# Patient Record
Sex: Male | Born: 1990 | Race: Black or African American
Health system: Northeastern US, Academic
[De-identification: ages and names within clinical notes are randomized; demographics above are authoritative.]

## PROBLEM LIST (undated history)

## (undated) ENCOUNTER — Ambulatory Visit: Admission: RE | Payer: Self-pay | Source: Ambulatory Visit | Admitting: Occupational Medicine

## (undated) DIAGNOSIS — J45909 Unspecified asthma, uncomplicated: Secondary | ICD-10-CM

---

## 2015-06-24 ENCOUNTER — Emergency Department (HOSPITAL_COMMUNITY)
Admission: EM | Admit: 2015-06-24 | Discharge: 2015-06-24 | Disposition: A | Discharge status: Home / Self Care | Attending: Emergency Medicine | Admitting: Emergency Medicine

## 2015-06-24 ENCOUNTER — Emergency Department (HOSPITAL_COMMUNITY)

## 2015-06-24 ENCOUNTER — Encounter (HOSPITAL_COMMUNITY): Payer: Self-pay | Admitting: Emergency Medicine

## 2015-06-24 DIAGNOSIS — R109 Unspecified abdominal pain: Secondary | ICD-10-CM | POA: Diagnosis present

## 2015-06-24 DIAGNOSIS — R509 Fever, unspecified: Secondary | ICD-10-CM | POA: Insufficient documentation

## 2015-06-24 DIAGNOSIS — R1084 Generalized abdominal pain: Secondary | ICD-10-CM | POA: Insufficient documentation

## 2015-06-24 DIAGNOSIS — J45909 Unspecified asthma, uncomplicated: Secondary | ICD-10-CM | POA: Insufficient documentation

## 2015-06-24 DIAGNOSIS — Z87891 Personal history of nicotine dependence: Secondary | ICD-10-CM | POA: Insufficient documentation

## 2015-06-24 HISTORY — DX: Unspecified asthma, uncomplicated: J45.909

## 2015-06-24 LAB — URINALYSIS, ROUTINE W REFLEX MICROSCOPIC
Bilirubin Urine: NEGATIVE
GLUCOSE, UA: NEGATIVE mg/dL
Hgb urine dipstick: NEGATIVE
KETONES UR: NEGATIVE mg/dL
LEUKOCYTES UA: NEGATIVE
NITRITE: NEGATIVE
PH: 5.5 (ref 5.0–8.0)
Protein, ur: NEGATIVE mg/dL
SPECIFIC GRAVITY, URINE: 1.01 (ref 1.005–1.030)

## 2015-06-24 LAB — COMPREHENSIVE METABOLIC PANEL
ALBUMIN: 4.8 g/dL (ref 3.5–5.0)
ALT: 18 U/L (ref 17–63)
ANION GAP: 6 (ref 5–15)
AST: 26 U/L (ref 15–41)
Alkaline Phosphatase: 74 U/L (ref 38–126)
BUN: 13 mg/dL (ref 6–20)
CHLORIDE: 106 mmol/L (ref 101–111)
CO2: 28 mmol/L (ref 22–32)
Calcium: 9.2 mg/dL (ref 8.9–10.3)
Creatinine, Ser: 1.31 mg/dL — ABNORMAL HIGH (ref 0.61–1.24)
GFR calc Af Amer: >60 mL/min (ref >60)
Glucose, Bld: 100 mg/dL — ABNORMAL HIGH (ref 65–99)
POTASSIUM: 3.3 mmol/L — AB (ref 3.5–5.1)
Sodium: 140 mmol/L (ref 135–145)
Total Bilirubin: 1.7 mg/dL — ABNORMAL HIGH (ref 0.3–1.2)
Total Protein: 7.8 g/dL (ref 6.5–8.1)

## 2015-06-24 LAB — CBC
HEMATOCRIT: 40.2 % (ref 39.0–52.0)
HEMOGLOBIN: 13.9 g/dL (ref 13.0–17.0)
MCH: 31.3 pg (ref 26.0–34.0)
MCHC: 34.6 g/dL (ref 30.0–36.0)
MCV: 90.5 fL (ref 78.0–100.0)
Platelets: 255 10*3/uL (ref 150–400)
RBC: 4.44 MIL/uL (ref 4.22–5.81)
RDW: 12.9 % (ref 11.5–15.5)
WBC: 5.8 10*3/uL (ref 4.0–10.5)

## 2015-06-24 LAB — LIPASE, BLOOD: LIPASE: 20 U/L (ref 11–51)

## 2015-06-24 MED ORDER — BISMUTH SUBSALICYLATE 262 MG/15ML PO SUSP
ORAL | Status: AC
  Filled 2015-06-24: qty 236

## 2015-06-24 MED ORDER — BISMUTH SUBSALICYLATE 262 MG/15ML PO SUSP
30.0000 mL | Freq: Once | ORAL | Status: AC
  Administered 2015-06-24: 30 mL via ORAL
  Filled 2015-06-24: qty 118

## 2015-06-24 MED ORDER — DIATRIZOATE MEGLUMINE & SODIUM 66-10 % PO SOLN
ORAL | Status: AC
  Filled 2015-06-24: qty 30

## 2015-06-24 MED ORDER — ACETAMINOPHEN 500 MG PO TABS
1000.0000 mg | ORAL_TABLET | Freq: Once | ORAL | Status: AC
  Administered 2015-06-24: 1000 mg via ORAL
  Filled 2015-06-24: qty 2

## 2015-06-24 MED ORDER — BISMUTH SUBSALICYLATE 262 MG PO CHEW: 524.0000 mg | CHEWABLE_TABLET | ORAL | Status: AC | PRN

## 2015-06-24 MED ORDER — IOHEXOL 300 MG/ML  SOLN
100.0000 mL | Freq: Once | INTRAMUSCULAR | Status: AC | PRN
Start: 2015-06-24 — End: 2015-06-24
  Administered 2015-06-24: 100 mL via INTRAVENOUS

## 2015-06-24 NOTE — ED Notes (Signed)
Patient given graham crackers and gingerale 

## 2015-06-24 NOTE — ED Notes (Signed)
[facility]. Pt c/o bilateral lower abdominal pain and SOB that began this morning. Pt hx of asthma. Denies urinary symptoms. Denies n/v/d.

## 2015-06-24 NOTE — Discharge Instructions (Signed)
Take Tylenol every 4 hours as needed for fever. Gradually advance your diet over the next 1-2 days.   Abdominal Pain, Adult Many things can cause abdominal pain. Usually, abdominal pain is not caused by a disease and will improve without treatment. It can often be observed and treated at home. Your health care provider will do a physical exam and possibly order blood tests and X-rays to help determine the seriousness of your pain. However, in many cases, more time must pass before a clear cause of the pain can be found. Before that point, your health care provider may not know if you need more testing or further treatment. HOME CARE INSTRUCTIONS Monitor your abdominal pain for any changes. The following actions may help to alleviate any discomfort you are experiencing:  Only take over-the-counter or prescription medicines as directed by your health care provider.  Do not take laxatives unless directed to do so by your health care provider.  Try a clear liquid diet (broth, tea, or water) as directed by your health care provider. Slowly move to a bland diet as tolerated. SEEK MEDICAL CARE IF:  You have unexplained abdominal pain.  You have abdominal pain associated with nausea or diarrhea.  You have pain when you urinate or have a bowel movement.  You experience abdominal pain that wakes you in the night.  You have abdominal pain that is worsened or improved by eating food.  You have abdominal pain that is worsened with eating fatty foods.  You have a fever. SEEK IMMEDIATE MEDICAL CARE IF:  Your pain does not go away within 2 hours.  You keep throwing up (vomiting).  Your pain is felt only in portions of the abdomen, such as the right side or the left lower portion of the abdomen.  You pass bloody or black tarry stools. MAKE SURE YOU:  Understand these instructions.  Will watch your condition.  Will get help right away if you are not doing well or get worse.   This  information is not intended to replace advice given to you by your health care provider. Make sure you discuss any questions you have with your health care provider.   Document Released: 03/19/2005 Document Revised: 02/28/2015 Document Reviewed: 02/16/2013 Elsevier Interactive Patient Education 2016 [employer]  Fever, Adult A fever is an increase in the body's temperature. It is usually defined as a temperature of 100F (38C) or higher. Brief mild or moderate fevers generally have no long-term effects, and they often do not require treatment. Moderate or high fevers may make you feel uncomfortable and can sometimes be a sign of a serious illness or disease. The sweating that may occur with repeated or prolonged fever may also cause dehydration. Fever is confirmed by taking a temperature with a thermometer. A measured temperature can vary with:  Age.  Time of day.  Location of the thermometer:  Mouth (oral).  Rectum (rectal).  Ear (tympanic).  Underarm (axillary).  Forehead (temporal). HOME CARE INSTRUCTIONS Pay attention to any changes in your symptoms. Take these actions to help with your condition:  Take over-the counter and prescription medicines only as told by your health care provider. Follow the dosing instructions carefully.  If you were prescribed an antibiotic medicine, take it as told by your health care provider. Do not stop taking the antibiotic even if you start to feel better.  Rest as needed.  Drink enough fluid to keep your urine clear or pale yellow. This helps to prevent dehydration.  Sponge yourself or bathe with room-temperature water to help reduce your body temperature as needed. Do not use ice water.  Do not overbundle yourself in blankets or heavy clothes. SEEK MEDICAL CARE IF:  You vomit.  You cannot eat or drink without vomiting.  You have diarrhea.  You have pain when you urinate.  Your symptoms do not improve with treatment.  You  develop new symptoms.  You develop excessive weakness. SEEK IMMEDIATE MEDICAL CARE IF:  You have shortness of breath or have trouble breathing.  You are dizzy or you faint.  You are disoriented or confused.  You develop signs of dehydration, such as a dry mouth, decreased urination, or paleness.  You develop severe pain in your abdomen.  You have persistent vomiting or diarrhea.  You develop a skin rash.  Your symptoms suddenly get worse.   This information is not intended to replace advice given to you by your health care provider. Make sure you discuss any questions you have with your health care provider.   Document Released: 12/03/2000 Document Revised: 02/28/2015 Document Reviewed: 08/03/2014 Elsevier Interactive Patient Education [employer]

## 2015-06-25 NOTE — ED Provider Notes (Signed)
CSN: [id number]     Arrival date & time 06/24/15  1342 History   First MD Initiated Contact with Patient 06/24/15 1504     Chief Complaint  Patient presents with  . Abdominal Pain     (Consider location/radiation/quality/duration/timing/severity/associated sxs/prior Treatment) Patient is a 25 y.o. male presenting with abdominal pain. The history is provided by the patient.  Abdominal Pain   Alexander Crosby is a 25 y.o. male who c/o crampy intermittent abdominal pain for < 1 day. No fever, vomiting or diarrhea. No sick contacts. Incarcerated. No chronic abdominal issues. No cough or SOB.There are no ither known modifying factors    Past Medical History  Diagnosis Date  . Asthma    History reviewed. No pertinent past surgical history. No family history on file. Social History  Substance Use Topics  . Smoking status: Former Smoker    Types: Cigarettes  . Smokeless tobacco: None  . Alcohol Use: No    Review of Systems  Gastrointestinal: Positive for abdominal pain.  All other systems reviewed and are negative.     Allergies  Review of patient's allergies indicates no known allergies.  Home Medications   Prior to Admission medications   Medication Sig Start Date End Date Taking? Authorizing Provider  bismuth subsalicylate (PEPTO-BISMOL) 262 MG chewable tablet Chew 2 tablets (524 mg total) by mouth as needed for indigestion. 06/24/15   Mancel Bale, MD   BP 146/73 mmHg  Pulse 81  Temp(Src) 101.9 F (38.8 C) (Oral)  Resp 18  Ht 6\' 4"  (1.93 m)  Wt 220 lb (99.791 kg)  BMI 26.79 kg/m2  SpO2 100% Physical Exam  Constitutional: He is oriented to person, place, and time. He appears well-developed and well-nourished.  HENT:  Head: Normocephalic and atraumatic.  Right Ear: External ear normal.  Left Ear: External ear normal.  Eyes: Conjunctivae and EOM are normal. Pupils are equal, round, and reactive to light.  Neck: Normal range of motion and phonation normal. Neck  supple.  Cardiovascular: Normal rate, regular rhythm and normal heart sounds.   Pulmonary/Chest: Effort normal and breath sounds normal. He exhibits no bony tenderness.  Abdominal: Soft. There is no tenderness.  Musculoskeletal: Normal range of motion.  Neurological: He is alert and oriented to person, place, and time. No cranial nerve deficit or sensory deficit. He exhibits normal muscle tone. Coordination normal.  Skin: Skin is warm, dry and intact.  Psychiatric: He has a normal mood and affect. His behavior is normal. Judgment and thought content normal.  Nursing note and vitals reviewed.   ED Course  Procedures (including critical care time)  Medications  bismuth subsalicylate (PEPTO BISMOL) 262 MG/15ML suspension 30 mL (30 mLs Oral Given 06/24/15 1644)  iohexol (OMNIPAQUE) 300 MG/ML solution 100 mL (100 mLs Intravenous Contrast Given 06/24/15 1740)  acetaminophen (TYLENOL) tablet 1,000 mg (1,000 mg Oral Given 06/24/15 1851)    Patient Vitals for the past 24 hrs:  BP Temp Temp src Pulse Resp SpO2 Height Weight  06/24/15 1843 146/73 mmHg 101.9 F (38.8 C) Oral 81 18 100 % - -  06/24/15 1645 145/86 mmHg - - 89 16 100 % - -  06/24/15 1350 157/93 mmHg 99.1 F (37.3 C) Oral 75 18 100 % 6\' 4"  (1.93 m) 220 lb (99.791 kg)    Pepto-Bismol given without relief.  CT ordered to r/o colitis, or appendicitis.  Increased temperature at d/c.   Labs Review Labs Reviewed  COMPREHENSIVE METABOLIC PANEL - Abnormal; Notable for the following:  Potassium 3.3 (*)    Glucose, Bld 100 (*)    Creatinine, Ser 1.31 (*)    Total Bilirubin 1.7 (*)    All other components within normal limits  LIPASE, BLOOD  CBC  URINALYSIS, ROUTINE W REFLEX MICROSCOPIC (NOT AT [facility])    Imaging Review Dg Chest 2 View  06/24/2015  CLINICAL DATA:  Shortness of breath. EXAM: CHEST  2 VIEW COMPARISON:  None. FINDINGS: The heart size and mediastinal contours are within normal limits. Both lungs are clear. The  visualized skeletal structures are unremarkable. IMPRESSION: Normal chest. Electronically Signed   By: Francene BoyersJames  Maxwell M.D.   On: 06/24/2015 14:34   Ct Abdomen Pelvis W Contrast  06/24/2015  CLINICAL DATA:  Patient with pelvic pain since this morning. Shortness of breath. History of asthma. EXAM: CT ABDOMEN AND PELVIS WITH CONTRAST TECHNIQUE: Multidetector CT imaging of the abdomen and pelvis was performed using the standard protocol following bolus administration of intravenous contrast. CONTRAST:  100mL OMNIPAQUE IOHEXOL 300 MG/ML  SOLN COMPARISON:  None. FINDINGS: Lower chest: Normal heart size. No consolidative pulmonary opacities. No pleural effusion. Hepatobiliary: Liver is normal in size and contour. No focal hepatic lesion is identified. Gallbladder is unremarkable. No intrahepatic or extrahepatic biliary ductal dilatation. Pancreas: Unremarkable Spleen: Unremarkable Adrenals/Urinary Tract: The adrenal glands are normal. Kidneys enhance symmetrically with contrast. No hydronephrosis. Urinary bladder is unremarkable. Stomach/Bowel: No abnormal bowel wall thickening or evidence for bowel obstruction. No free fluid or free intraperitoneal air. The appendix is normal. Normal morphology to the stomach. Small amount of oral contrast within the distal esophagus, raising the possibility of reflux. Vascular/Lymphatic: Normal caliber abdominal aorta. No retroperitoneal lymphadenopathy. Other: Prostate unremarkable. Small fat containing right inguinal hernia. Musculoskeletal: No aggressive or acute appearing osseous lesions. IMPRESSION: No acute process within the abdomen or pelvis. Small amount of oral contrast material within the distal esophagus, raising the possibility of reflux. Electronically Signed   By: Annia Beltrew  Davis M.D.   On: 06/24/2015 18:12   I have personally reviewed and evaluated these images and lab results as part of my medical decision-making.   EKG Interpretation None      MDM   Final  diagnoses:  Generalized abdominal pain  Febrile illness    Nonspefic fever with abdominal cramping. Nontoxic patient with negative CT. Doubt SBI.  Nursing Notes Reviewed/ Care Coordinated Applicable Imaging Reviewed Interpretation of Laboratory Data incorporated into ED treatment  The patient appears reasonably screened and/or stabilized for discharge and I doubt any other medical condition or other [facility] requiring further screening, evaluation, or treatment in the ED at this time prior to discharge.  Plan: Home Medications- Tylenol prn; Home Treatments- Gradually advance diet; return here if the recommended treatment, does not improve the symptoms; Recommended follow up- PCP prn     Mancel BaleElliott Ercil Cassis, MD 06/25/15 1207

## 2016-04-07 ENCOUNTER — Ambulatory Visit
Admission: RE | Admit: 2016-04-07 | Discharge: 2016-04-07 | Disposition: A | Discharge status: Home / Self Care | Payer: Self-pay | Source: Ambulatory Visit | Attending: Occupational Medicine | Admitting: Occupational Medicine

## 2016-04-07 ENCOUNTER — Other Ambulatory Visit
Admission: RE | Admit: 2016-04-07 | Discharge: 2016-04-07 | Disposition: A | Discharge status: Home / Self Care | Payer: Self-pay | Source: Ambulatory Visit | Attending: Occupational Medicine | Admitting: Occupational Medicine

## 2016-04-08 LAB — VARICELLA ZOSTER IGG AB: VZV IgG: POSITIVE

## 2016-04-08 LAB — RUBELLA ANTIBODY, IGG: Rubella IgG AB: POSITIVE

## 2016-04-08 LAB — MEASLES IGG AB: Measles IgG: POSITIVE

## 2016-04-15 ENCOUNTER — Other Ambulatory Visit
Admission: RE | Admit: 2016-04-15 | Discharge: 2016-04-15 | Disposition: A | Discharge status: Home / Self Care | Payer: Self-pay | Source: Ambulatory Visit | Attending: Pediatrics | Admitting: Pediatrics

## 2016-04-15 LAB — LIPID PANEL
Chol/HDL Ratio: 3.3
Cholesterol: 151 mg/dL
HDL: 46 mg/dL
LDL Calculated: 88 mg/dL
Non HDL Cholesterol: 105 mg/dL
Triglycerides: 85 mg/dL

## 2016-04-15 LAB — COMPREHENSIVE METABOLIC PANEL
ALT: 32 U/L (ref 0–50)
AST: 54 U/L — ABNORMAL HIGH (ref 0–50)
Albumin: 4.6 g/dL (ref 3.5–5.2)
Alk Phos: 57 U/L (ref 40–130)
Anion Gap: 16 (ref 7–16)
Bilirubin,Total: 0.7 mg/dL (ref 0.0–1.2)
CO2: 24 mmol/L (ref 20–28)
Calcium: 9.2 mg/dL (ref 9.0–10.3)
Chloride: 103 mmol/L (ref 96–108)
Creatinine: 1.11 mg/dL (ref 0.67–1.17)
GFR,Black: 106 *
GFR,Caucasian: 91 *
Lab: 15 mg/dL (ref 6–20)
Potassium: 4.2 mmol/L (ref 3.3–5.1)
Sodium: 143 mmol/L (ref 133–145)
Total Protein: 7 g/dL (ref 6.3–7.7)

## 2016-04-15 LAB — HEPATITIS A,B,C,PANEL
HBV Core Ab: NEGATIVE
HBV S Ab Quant: 35.91 m[IU]/mL
HBV S Ab: POSITIVE
HBV S Ag: NEGATIVE
Hep C Ab: NEGATIVE
Hepatitis A IGG: POSITIVE

## 2016-04-15 LAB — BILIRUBIN, DIRECT: Bilirubin,Direct: <0.2 mg/dL (ref 0.0–0.3)

## 2016-04-15 LAB — GLUCOSE: Glucose: 91 mg/dL (ref 60–99)

## 2016-04-15 LAB — HIV 1&2 ANTIGEN/ANTIBODY: HIV 1&2 ANTIGEN/ANTIBODY: NONREACTIVE

## 2016-04-16 LAB — REF RESOLUTION

## 2016-04-16 LAB — HEMOGLOBIN A1C: Hemoglobin A1C: 5.7 % (ref 4.0–6.0)

## 2016-10-08 ENCOUNTER — Other Ambulatory Visit: Payer: Self-pay | Admitting: Gastroenterology

## 2016-10-08 DIAGNOSIS — R1084 Generalized abdominal pain: Secondary | ICD-10-CM

## 2016-11-05 ENCOUNTER — Ambulatory Visit: Payer: Medicaid Other

## 2016-11-18 ENCOUNTER — Ambulatory Visit: Payer: Medicaid Other

## 2017-02-16 IMAGING — DX DG CHEST 2V
3 series · 3 of 3 positions shown · non-contrast
Comparison: None.

CLINICAL DATA: Shortness of breath.

EXAM:
CHEST  2 VIEW

[chest pa (1 of 2)]
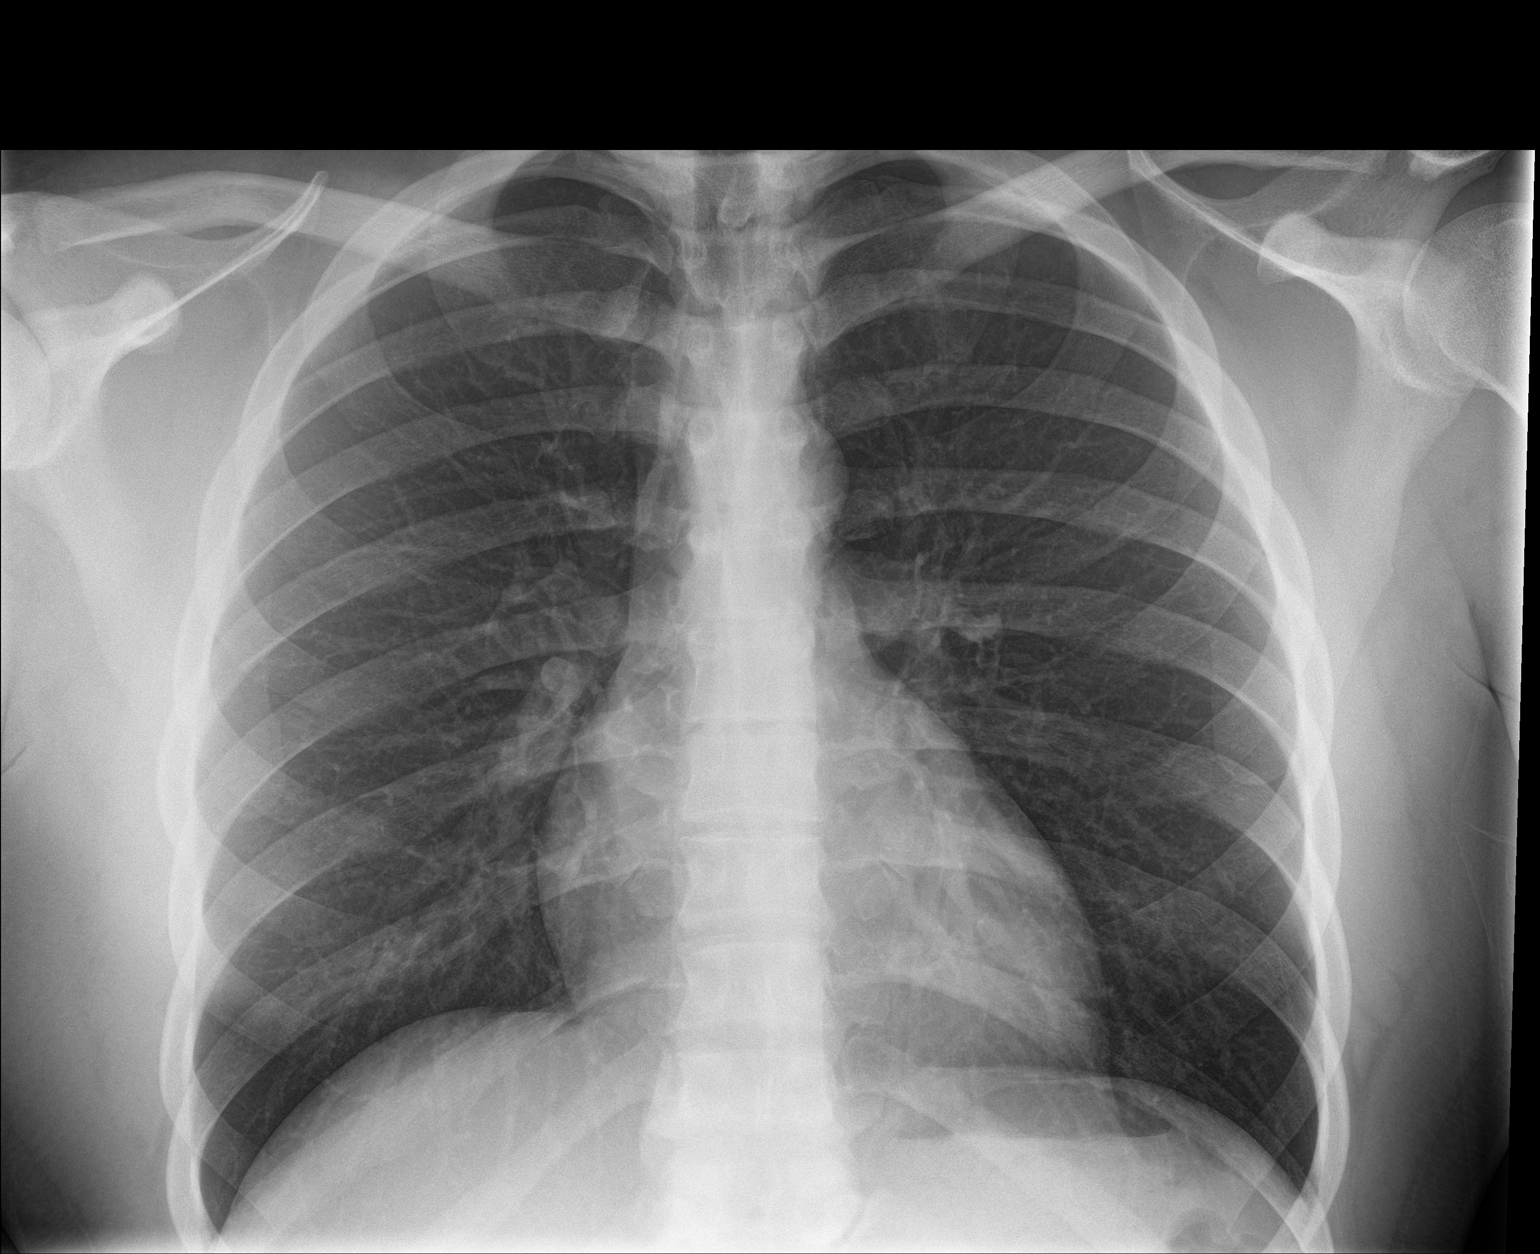

[chest lat]
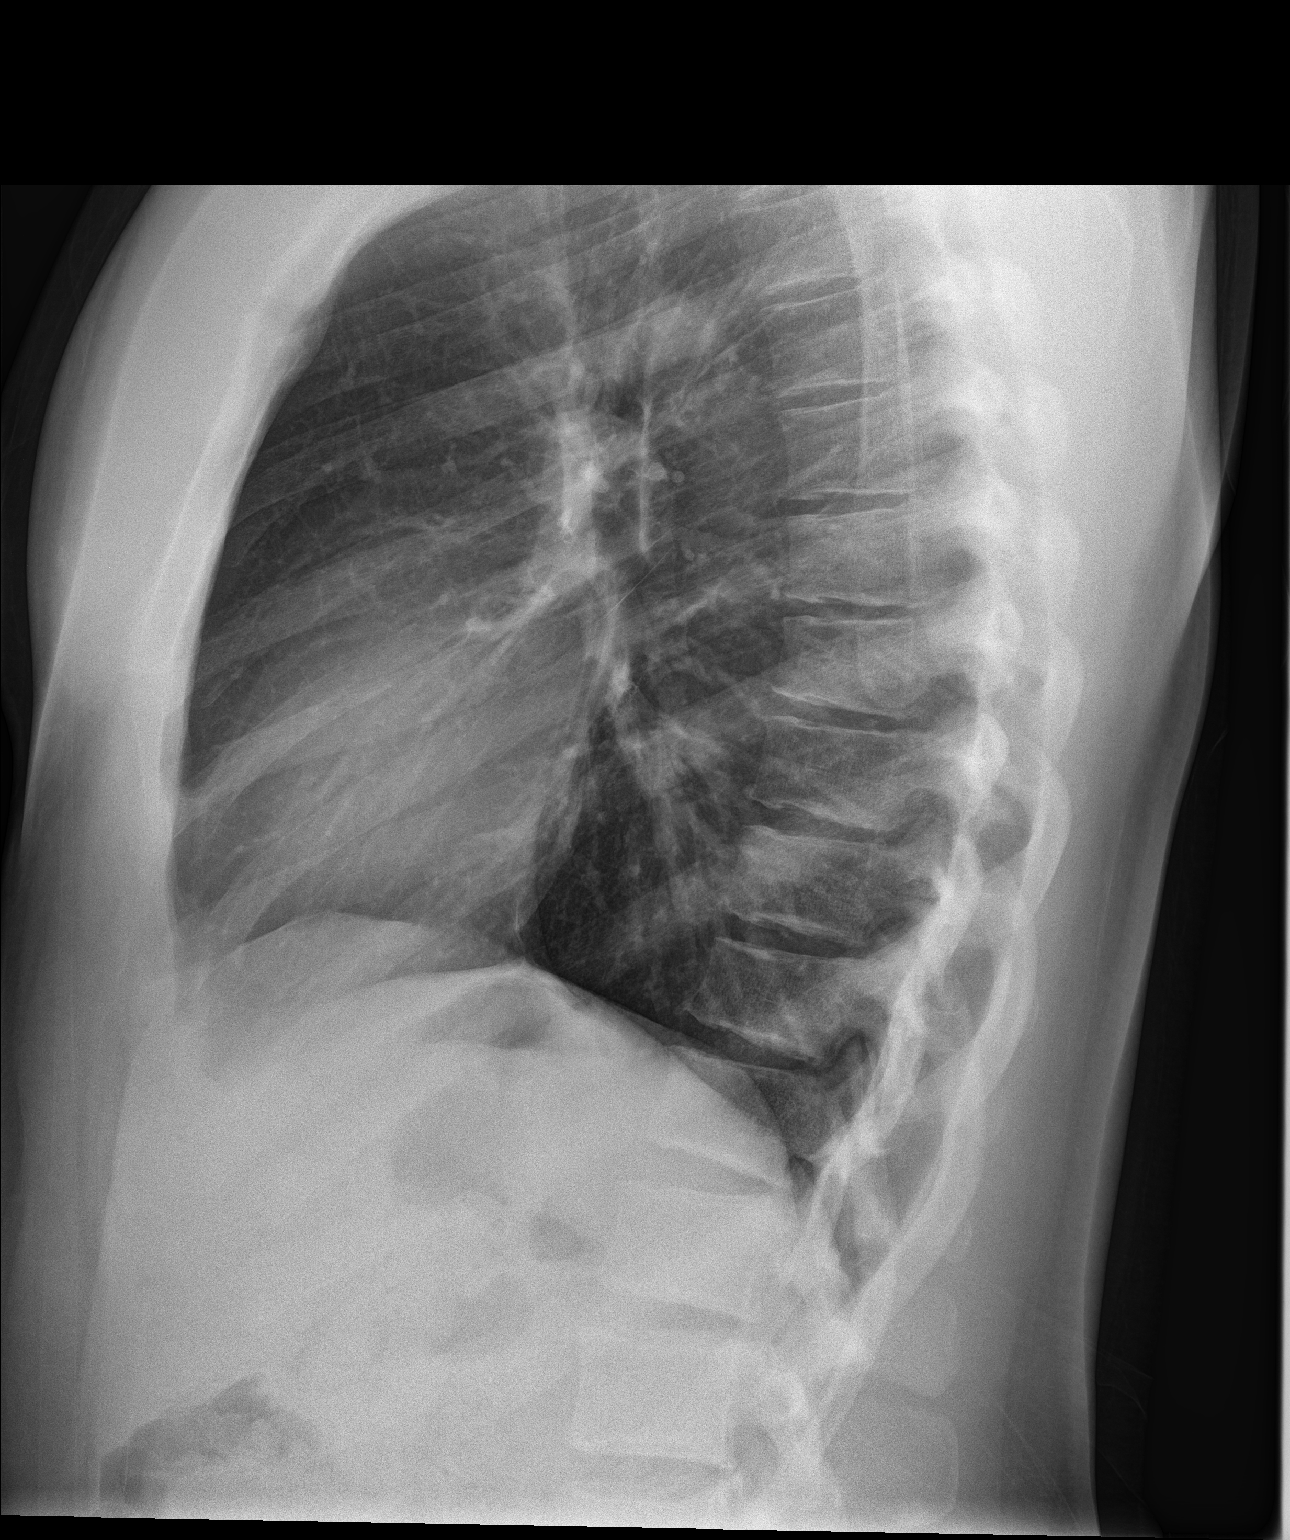

[chest pa (2 of 2)]
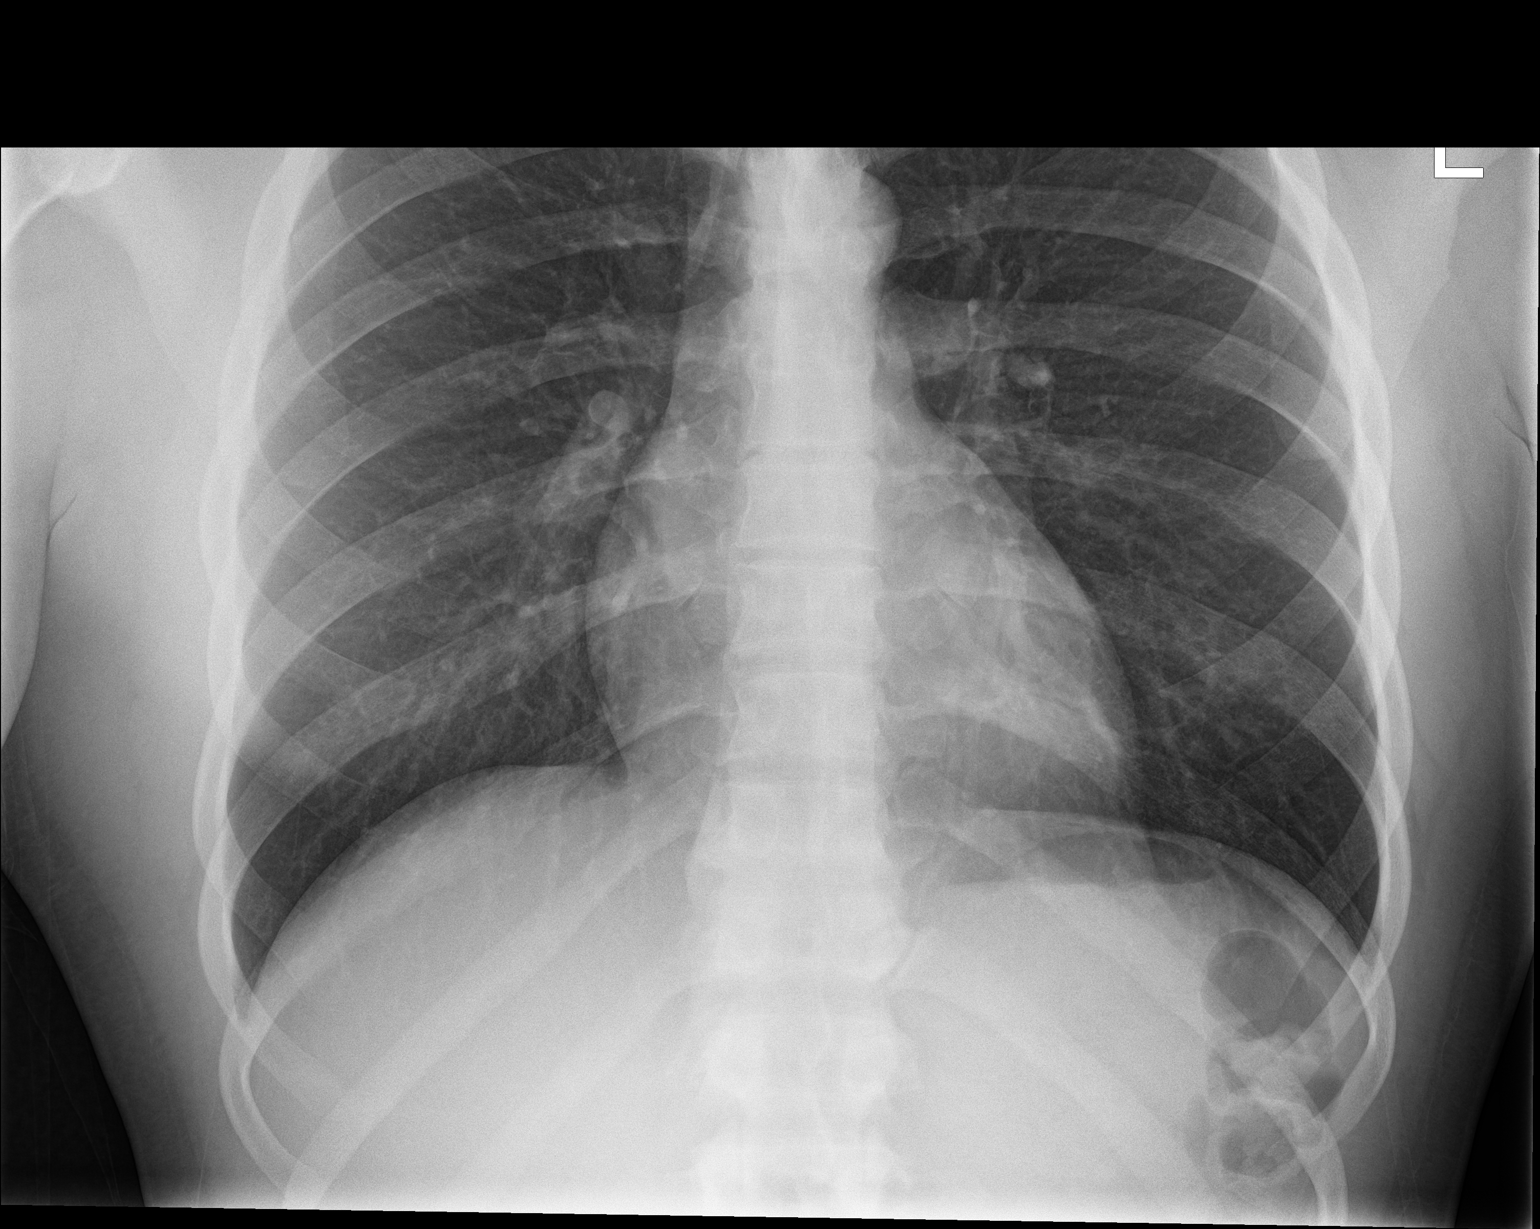

[3 of 3 positions shown; findings below may reference images not displayed]

FINDINGS: The heart size and mediastinal contours are within normal limits.
Both lungs are clear. The visualized skeletal structures are
unremarkable.
IMPRESSION: Normal chest.

## 2017-02-16 IMAGING — CT CT ABD-PELV W/ CM
2 of 4 series · 16 of 46 positions shown, 18 images · IV contrast (omnipaque)
Comparison: None.

CLINICAL DATA: Patient with pelvic pain since this morning.
Shortness of breath. History of asthma.

EXAM:
CT ABDOMEN AND PELVIS WITH CONTRAST
TECHNIQUE: Multidetector CT imaging of the abdomen and pelvis was performed
using the standard protocol following bolus administration of
intravenous contrast.
CONTRAST:  100mL OMNIPAQUE IOHEXOL 300 MG/ML  SOLN

[Series 2: abd_pel_with 5.0 b40f · axial · 0.72mm/px · z∈[-446,-6]mm · 13 of 98 slices shown, 15 images]
[im 5/98  soft-tissue]
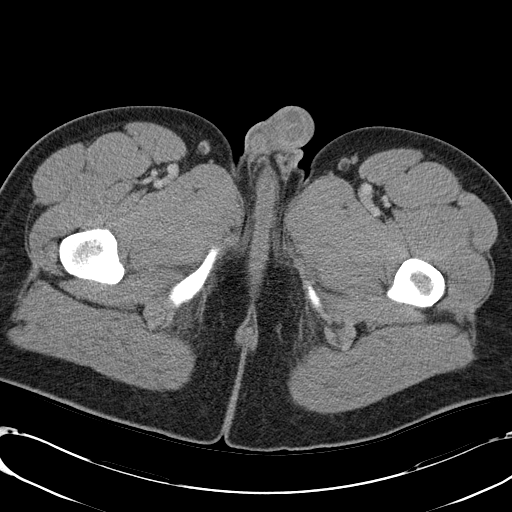
[im 5/98  bone]
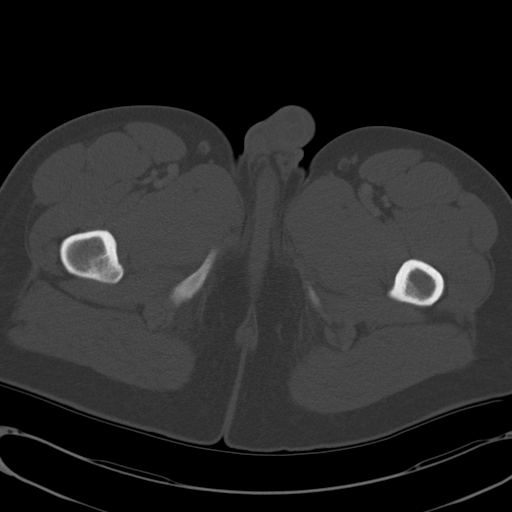
[im 13/98  soft-tissue]
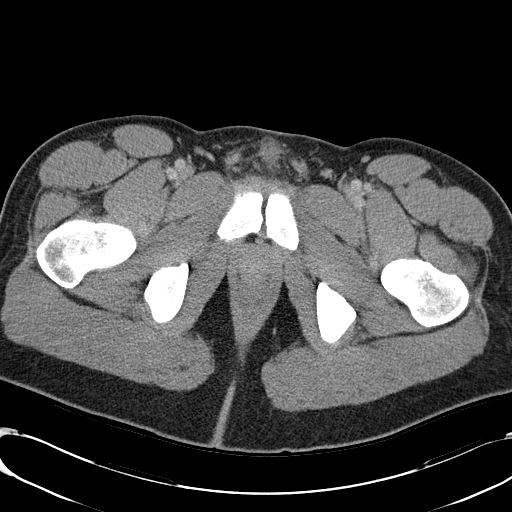
[im 21/98  soft-tissue]
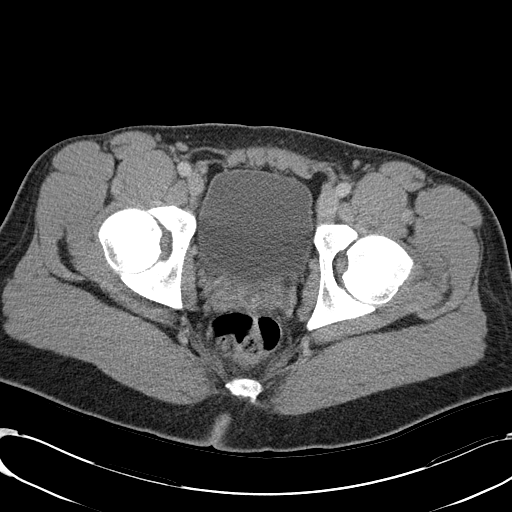
[im 29/98  soft-tissue]
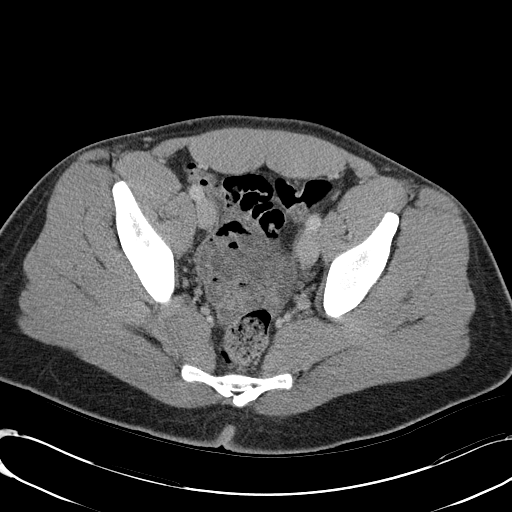
[im 33/98  soft-tissue]
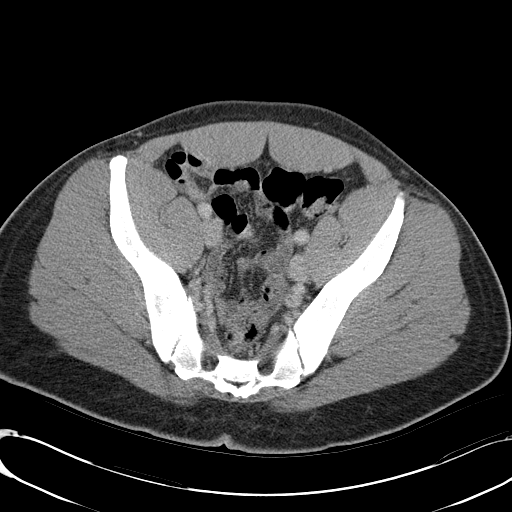
[im 41/98  soft-tissue]
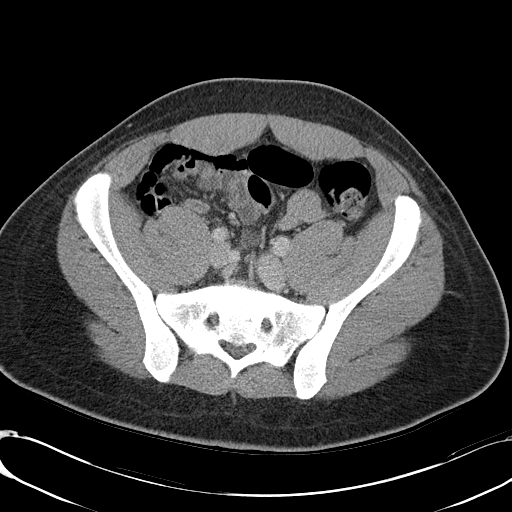
[im 49/98  soft-tissue]
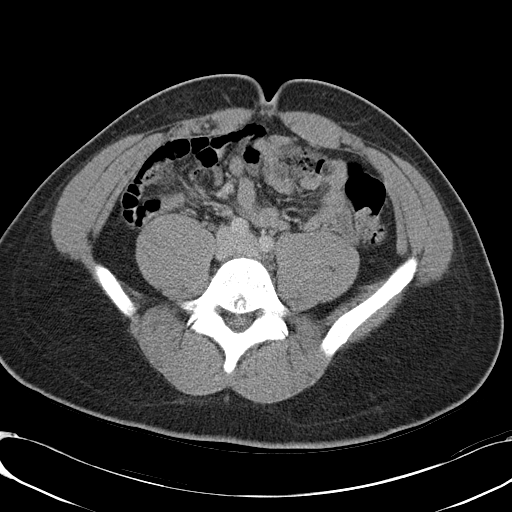
[im 57/98  soft-tissue]
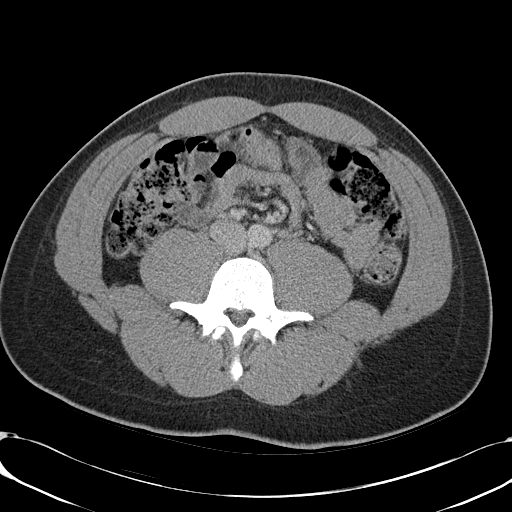
[im 65/98  soft-tissue]
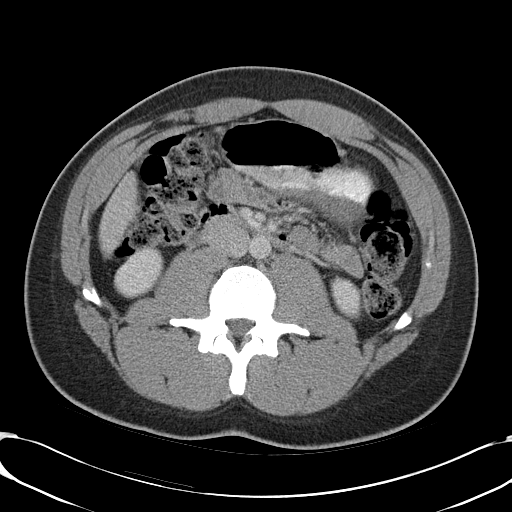
[im 65/98  bone]
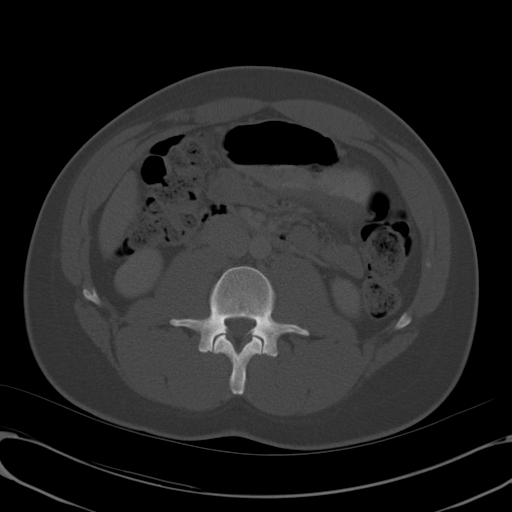
[im 69/98  soft-tissue]
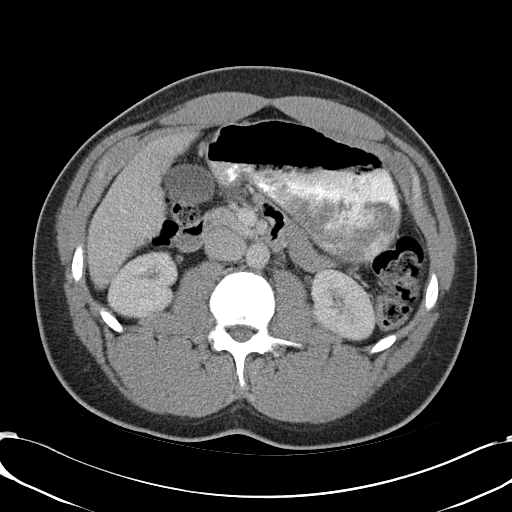
[im 77/98  soft-tissue]
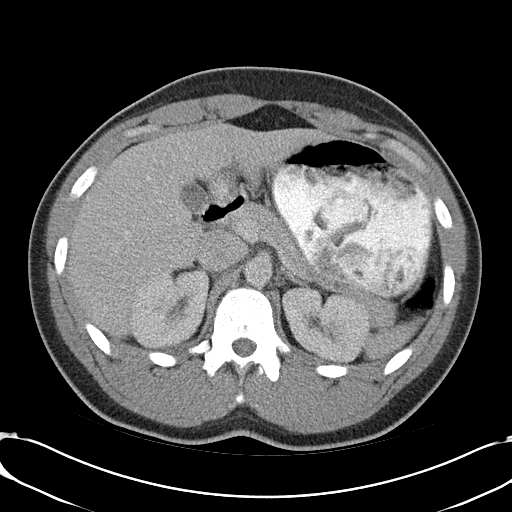
[im 85/98  soft-tissue]
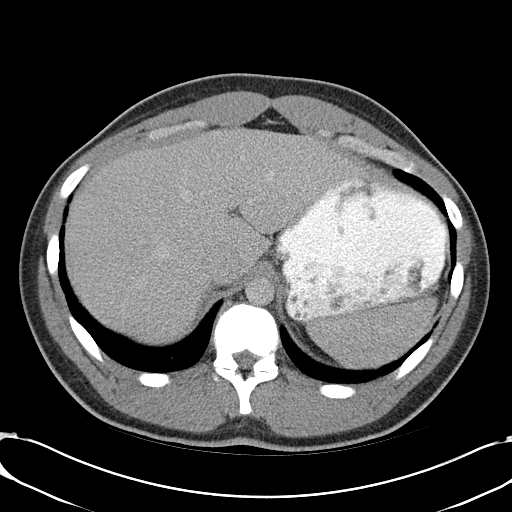
[im 93/98  soft-tissue]
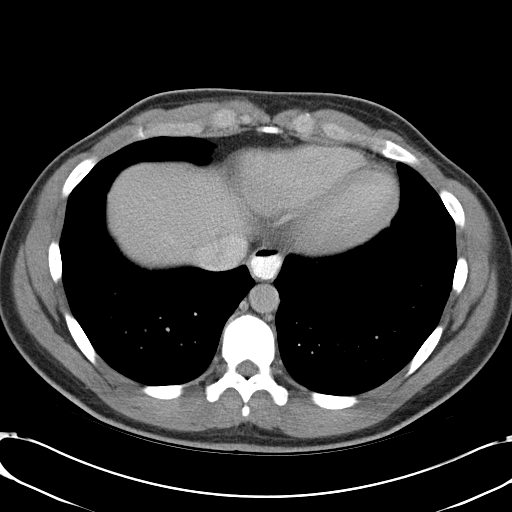

[Series 3: abd_pel_with 3.0 spo cor · coronal · 0.74mm/px · 3 of 82 slices shown]
[im 28/82  soft-tissue]
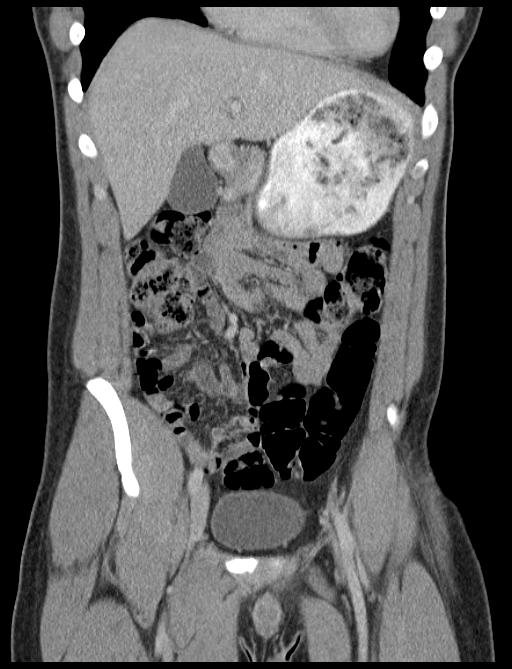
[im 37/82  soft-tissue]
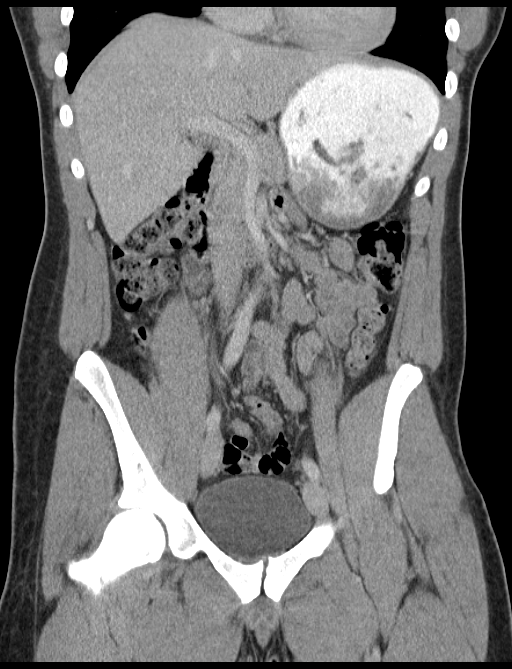
[im 46/82  soft-tissue]
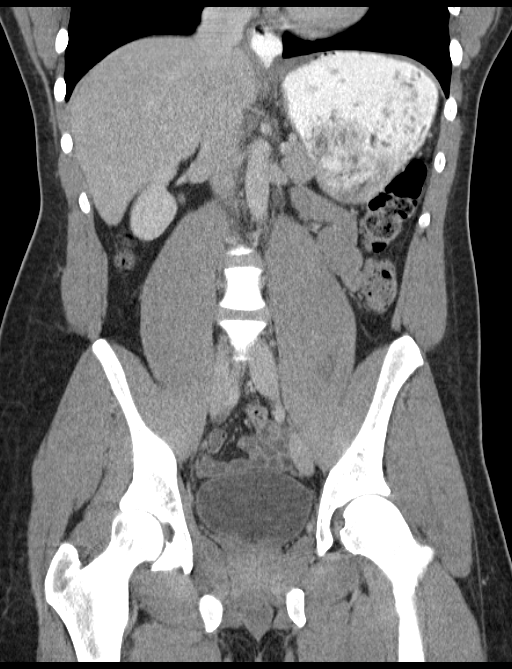

[16 of 46 positions shown; findings below may reference images not displayed]

FINDINGS: Lower chest: Normal heart size. No consolidative pulmonary
opacities. No pleural effusion.

Hepatobiliary: Liver is normal in size and contour. No focal hepatic
lesion is identified. Gallbladder is unremarkable. No intrahepatic
or extrahepatic biliary ductal dilatation.

Pancreas: Unremarkable

Spleen: Unremarkable

Adrenals/Urinary Tract: The adrenal glands are normal. Kidneys
enhance symmetrically with contrast. No hydronephrosis. Urinary
bladder is unremarkable.

Stomach/Bowel: No abnormal bowel wall thickening or evidence for
bowel obstruction. No free fluid or free intraperitoneal air. The
appendix is normal. Normal morphology to the stomach. Small amount
of oral contrast within the distal esophagus, raising the
possibility of reflux.

Vascular/Lymphatic: Normal caliber abdominal aorta. No
retroperitoneal lymphadenopathy.

Other: Prostate unremarkable. Small fat containing right inguinal
hernia.

Musculoskeletal: No aggressive or acute appearing osseous lesions.
IMPRESSION: No acute process within the abdomen or pelvis.

Small amount of oral contrast material within the distal esophagus,
raising the possibility of reflux.

## 2017-02-23 ENCOUNTER — Emergency Department: Payer: Self-pay

## 2017-02-23 ENCOUNTER — Emergency Department
Admission: EM | Admit: 2017-02-23 | Discharge: 2017-02-23 | Disposition: A | Discharge status: Home / Self Care | Payer: Self-pay | Source: Ambulatory Visit | Attending: Emergency Medicine | Admitting: Emergency Medicine

## 2017-02-23 ENCOUNTER — Encounter: Payer: Self-pay | Admitting: Emergency Medicine

## 2017-02-23 DIAGNOSIS — J4521 Mild intermittent asthma with (acute) exacerbation: Secondary | ICD-10-CM | POA: Insufficient documentation

## 2017-02-23 DIAGNOSIS — R5383 Other fatigue: Secondary | ICD-10-CM

## 2017-02-23 DIAGNOSIS — R0602 Shortness of breath: Secondary | ICD-10-CM

## 2017-02-23 DIAGNOSIS — R05 Cough: Secondary | ICD-10-CM

## 2017-02-23 DIAGNOSIS — R079 Chest pain, unspecified: Secondary | ICD-10-CM

## 2017-02-23 DIAGNOSIS — F17210 Nicotine dependence, cigarettes, uncomplicated: Secondary | ICD-10-CM | POA: Insufficient documentation

## 2017-02-23 HISTORY — DX: Unspecified asthma, uncomplicated: J45.909

## 2017-02-23 MED ORDER — METHYLPREDNISOLONE 4 MG PO TBPK *A*
ORAL_TABLET | ORAL | 0 refills | Status: DC
Start: 2017-02-23 — End: 2019-09-12

## 2017-02-23 MED ORDER — DOXYCYCLINE HYCLATE 100 MG PO CAPS *I*
100.0000 mg | ORAL_CAPSULE | Freq: Two times a day (BID) | ORAL | 0 refills | Status: AC
Start: 2017-02-23 — End: 2017-03-05

## 2017-02-23 MED ORDER — ALBUTEROL SULFATE (2.5 MG/3ML) 0.083% IN NEBU *I*
7.5000 mg | INHALATION_SOLUTION | RESPIRATORY_TRACT | Status: AC
Start: 2017-02-23 — End: 2017-02-23
  Administered 2017-02-23: 7.5 mg via RESPIRATORY_TRACT
  Filled 2017-02-23: qty 9

## 2017-02-23 MED ORDER — IPRATROPIUM BROMIDE 0.02 % IN SOLN *I*
500.0000 ug | RESPIRATORY_TRACT | Status: AC
Start: 2017-02-23 — End: 2017-02-23
  Administered 2017-02-23: 500 ug via RESPIRATORY_TRACT
  Filled 2017-02-23: qty 2.5

## 2017-02-23 MED ORDER — METHYLPREDNISOLONE SOD SUCC 125 MG IJ SOLR(62.5MG/ML) *WRAPPED*
125.0000 mg | Freq: Once | INTRAMUSCULAR | Status: AC
Start: 2017-02-23 — End: 2017-02-23
  Administered 2017-02-23: 125 mg via INTRAMUSCULAR
  Filled 2017-02-23: qty 2

## 2017-02-23 NOTE — ED Provider Notes (Signed)
History     Chief Complaint   Patient presents with    Cough     Pt presents to the ED with c/o SOB, non-productive cough x 1.5 weeks. unsure if he has had fevers. pt has hx asthma. using inhalers with no relief.    Shortness of Breath     HPI Comments: This is a 26 year old male smoker with a past history significant for asthma who presents complaining of one and a half weeks of progressive cough, fatigue and decreased appetite.  He is here with his wife who states that 1-1/2 weeks ago she began with sinus congestion, rhinorrhea, sore throat and a productive cough with thick sputum.  Most of the symptoms have appeared to improve although he continues to suffer from coughing fits that are preventing him from sleeping.  There are no documented fevers or chills, but she does state she has noticed some night sweats.  No vomiting or diarrhea.  He has been using his albuterol inhaler without good relief.              History provided by:  Patient  Language interpreter used: No        Medical/Surgical/Family History     No past medical history on file.     There is no problem list on file for this patient.           No past surgical history on file.  No family history on file.       Social History   Substance Use Topics    Smoking status: Not on file    Smokeless tobacco: Not on file    Alcohol use Not on file     Living Situation     Questions Responses    Patient lives with     Homeless     Caregiver for other family member     External Services     Employment     Domestic Violence Risk                 Review of Systems   Review of Systems   Constitutional: Positive for diaphoresis and fatigue. Negative for chills and fever.   HENT: Positive for sore throat. Negative for congestion, ear pain, facial swelling, nosebleeds, postnasal drip, rhinorrhea, sinus pain, sinus pressure and sneezing.    Respiratory: Positive for cough, shortness of breath (with coughing fits ) and wheezing.    Cardiovascular: Negative for  chest pain and palpitations.   Gastrointestinal: Negative for abdominal pain, diarrhea, nausea and vomiting.   All other systems reviewed and are negative.      Physical Exam     Triage Vitals  Triage Start: Start, (02/23/17 0235)   First Recorded BP: 134/77, Resp: 20, Temp: 35.9 C (96.6 F), Temp src: TEMPORAL Oxygen Therapy SpO2: 99 %, O2 Device: None (Room air),   Heart Rate (via Pulse Ox): 80, (02/23/17 0235).  First Pain Reported  0-10 Scale: 8, Pain Location/Orientation: Chest;Neck, (02/23/17 0235)       Physical Exam   Constitutional: He is oriented to person, place, and time. He appears well-developed and well-nourished.   HENT:   Head: Normocephalic.   Right Ear: External ear normal.   Left Ear: External ear normal.   Nose: Nose normal.   Mouth/Throat: Oropharynx is clear and moist.   Eyes: Conjunctivae and EOM are normal. Pupils are equal, round, and reactive to light.   Neck: Normal range of motion. Neck supple.  Cardiovascular: Normal rate, regular rhythm and normal heart sounds.    Pulmonary/Chest: No respiratory distress. He has wheezes.   Patient with excessive coughing, scattered wheezing can be appreciated in all fields.    Musculoskeletal: He exhibits no edema.   Lymphadenopathy:     He has no cervical adenopathy.   Neurological: He is alert and oriented to person, place, and time.   Skin: Skin is warm.   Psychiatric: He has a normal mood and affect. His behavior is normal. Judgment and thought content normal.   Nursing note and vitals reviewed.      Medical Decision Making      Amount and/or Complexity of Data Reviewed  Tests in the radiology section of CPT: ordered and reviewed  Review and summarize past medical records: yes        Initial Evaluation:  ED First Provider Contact     Date/Time Event User Comments    02/23/17 0400 ED First Provider Contact Sigmund Morera Initial Face to Face Provider Contact          Patient seen by me on arrival date of 02/23/2017.    Assessment:  26 y.o.male  comes to the ED with cough, fatigue and wheezing x 1.5 weeks in the setting of asthma and smoking.      Differential Diagnosis includes: asthmatic bronchitis      Plan: Mini heart and solumedrol here with improvement. CXR clear by my read. Patient is stable to be discharged to home with doxycycline and medrol dose pack, outpatient follow up with pcp.    Early OsmondHRYSA L Chereese Cilento, PA    Supervising physician cummins was immediately available        Kristine GarbeCharno, Sajan [place], [place]  02/23/17 [phone]

## 2017-02-23 NOTE — Discharge Instructions (Signed)
Continue albuterol inhaler as needed.   Stop smoking!!!

## 2017-02-23 NOTE — ED Notes (Signed)
Pt. Presenting to ED with dry nonproductive cough X1.5 weeks. Pt. Has hx of asthma, has found no relief with rescue inhalers. Pt. Appears comfortable, skin is clammy. VSS. Call bell within reach, pt. Able to make needs known.

## 2019-09-12 ENCOUNTER — Emergency Department
Admission: EM | Admit: 2019-09-12 | Discharge: 2019-09-13 | Disposition: A | Discharge status: Home / Self Care | Payer: Self-pay | Source: Ambulatory Visit | Attending: Emergency Medicine | Admitting: Emergency Medicine

## 2019-09-12 ENCOUNTER — Ambulatory Visit
Admission: AD | Admit: 2019-09-12 | Discharge: 2019-09-12 | Disposition: A | Discharge status: Other Acute Inpatient Hospital | Payer: Self-pay | Source: Ambulatory Visit

## 2019-09-12 DIAGNOSIS — Y998 Other external cause status: Secondary | ICD-10-CM | POA: Insufficient documentation

## 2019-09-12 DIAGNOSIS — Y9389 Activity, other specified: Secondary | ICD-10-CM | POA: Insufficient documentation

## 2019-09-12 DIAGNOSIS — W268XXA Contact with other sharp object(s), not elsewhere classified, initial encounter: Secondary | ICD-10-CM | POA: Insufficient documentation

## 2019-09-12 DIAGNOSIS — W458XXA Other foreign body or object entering through skin, initial encounter: Secondary | ICD-10-CM | POA: Insufficient documentation

## 2019-09-12 DIAGNOSIS — Y9289 Other specified places as the place of occurrence of the external cause: Secondary | ICD-10-CM | POA: Insufficient documentation

## 2019-09-12 DIAGNOSIS — S60351A Superficial foreign body of right thumb, initial encounter: Secondary | ICD-10-CM

## 2019-09-12 DIAGNOSIS — S61041A Puncture wound with foreign body of right thumb without damage to nail, initial encounter: Secondary | ICD-10-CM | POA: Insufficient documentation

## 2019-09-12 DIAGNOSIS — M795 Residual foreign body in soft tissue: Secondary | ICD-10-CM

## 2019-09-12 MED ORDER — LIDOCAINE HCL 1 % IJ SOLN *I*
5.0000 mL | Freq: Once | INTRAMUSCULAR | Status: AC
Start: 2019-09-12 — End: 2019-09-13
  Administered 2019-09-13: 5 mL via INTRADERMAL
  Filled 2019-09-12: qty 20

## 2019-09-12 MED ORDER — TETANUS-DIPHTH-ACELL PERT 5-2.5-18.5 LF-MCG/0.5 IM SUSP *WRAPPED*
0.5000 mL | Freq: Once | INTRAMUSCULAR | Status: AC
Start: 2019-09-12 — End: 2019-09-12
  Administered 2019-09-12: 20:00:00 0.5 mL via INTRAMUSCULAR

## 2019-09-12 NOTE — ED Procedure Documentation (Signed)
Procedures   Foreign body removal  Performed by: Vinnie Langton, NP  Authorized by: Vinnie Langton, NP     Consent:     Consent obtained:  Verbal    Consent given by:  Patient    Risks discussed:  Bleeding, infection, nerve damage, poor cosmetic result, pain and incomplete removal    Alternatives discussed:  No treatment and referral  Universal protocol:     Procedure explained and questions answered to patient or proxy's satisfaction: yes      Relevant documents present and verified: no      Test results available and properly labeled: no      Imaging studies available: no      Required blood products, implants, devices, and special equipment available: no      Site/side marked: yes      Immediately prior to procedure, a time out was called: yes      Patient identity confirmed:  Verbally with patient  Location:     Location:  Finger    Finger location:  R thumb    Depth:  Subcutaneous    Tendon involvement:  None  Pre-procedure details:     Imaging:  None    Neurovascular status: intact    Anesthesia (see MAR for exact dosages):     Anesthesia method:  Local infiltration    Local anesthetic:  Lidocaine 1% w/o epi  Procedure details:     Scalpel size:  11    Incision length:  .5    Dissection of underlying tissues: no      Bloodless field: yes      Removal mechanism:  Hemostat    Foreign bodies recovered:  None (Unable to successfully remove fishhook from left thumb due to barbed edge being stuck in soft tissue and unable to advance fishhook to create posterior method to clip barb.)  Post-procedure details:     Neurovascular status: intact      Dressing:  Adhesive bandage and bulky dressing    Patient tolerance of procedure:  Procedure terminated at patient's request (Patient requesting that he is right-handed and uses his hand for work requirements, is requesting transfer to emergency department for further evaluation and management of complex fishhook removal.)        Vinnie Langton, NP     Vinnie Langton, NP  09/12/19 2107

## 2019-09-12 NOTE — ED Provider Notes (Addendum)
History     Chief Complaint   Patient presents with    Foreign Body in Skin       History provided by:  Patient  Language interpreter used: No      29 y/o M w/ no significant pmhx presents for a fish hook stuck in his R thumb.  Around 1900, patient was pulling out his fishing things from his trunk and the hook was stuck on a folding chair.  When patient was trying to pull it off, the hook went into his right thumb.  It is a used hook  At urgent care they made a small incision but were unable to remove the hook.  He did have his Tdap updated.  Denies weakness, numbness, uncontrolled bleeding    Medical/Surgical/Family History     Past Medical History:   Diagnosis Date    Asthma         There is no problem list on file for this patient.           History reviewed. No pertinent surgical history.  No family history on file.       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   Skin: Positive for wound.   Neurological: Negative for numbness.   Hematological: Does not bruise/bleed easily.     Physical Exam     Triage Vitals  Triage Start: Start, (09/12/19 2122)   First Recorded BP: 142/70, Resp: 16, Temp: 36.4 C (97.5 F) Oxygen Therapy SpO2: 100 %, O2 Device: None (Room air), Heart Rate: 78, (09/12/19 2123)  .  First Pain Reported  0-10 Scale: 4, Pain Location/Orientation: Finger (Comment which one), (09/12/19 2123)           Physical Exam  Vitals and nursing note reviewed.   Constitutional:       General: He is not in acute distress.     Appearance: He is not  ill-appearing.   Cardiovascular:      Pulses:           Radial pulses are 2+ on the right side and 2+ on the left side.   Musculoskeletal:      Comments:   Able to flex MCP and DIP  Sensation intact throughout the thumb  Appropriate strength   Skin:     Capillary Refill: Capillary refill takes less than 2 seconds.      Comments:   See photo   Neurological:      Mental Status: He is alert and oriented to person, place, and time.   Psychiatric:         Thought Content: Thought content normal.       Medical Decision Making   Patient seen by me on:  09/12/2019    Assessment:    29 y/o M w/  no significant pmhx presents for a fish hook stuck in his R thumb since 1900. Motor, sensation, and strength is intact. +2 radial pulses  Bleeding is controlled.  Patient's Tdap was updated  Will provide local anesthesia and remove the hook.    Differential diagnosis:  Foreign body in soft tissue    Plan:  Orders Placed This Encounter      lidocaine hcl 1 % injection 5 mL    Independent review of: chart/prior records    ED Course and Disposition:    Fishhook removed after adequate analgesia.  Wound was washed out.  Patient does have numbness in the distal thumb from the local lidocaine.  Placed on a course of Keflex and discharged home with return to ED precautions.            Marvell Fuller, DO      Resident Attestation:    Patient seen by me on 09/12/2019.    I saw and evaluated the patient. I agree with the resident's/fellow's findings and plan of care as documented above.  Author:  Noah Delaine, MD       Marvell Fuller, DO  Resident  09/13/19 Othella Boyer, MD  09/13/19 [phone]

## 2019-09-12 NOTE — ED Triage Notes (Signed)
Fishing hook was stuck on a chair in the patient's truck when he tried to free it to which it then slid into his right upper thumb. Tingling and pain, no numbness, and he still has feeling in the thump. Pain is 5/10 and localized to the thumb. He tried pulling it out himself when he experienced a sharp pain down his arm so he stopped.        Triage Note   Su Hoff, RN

## 2019-09-12 NOTE — ED Triage Notes (Signed)
Pt has fish hook stuck in R thumb since 1900. Unable to remove at [facility]. Tdap updated at [facility].        Triage Note   Rachel Bo, RN

## 2019-09-12 NOTE — Discharge Instructions (Signed)
Due to high concern for fishhook in right thumb with unsuccessful attempt of removal in urgent care, advised that patient seeks further evaluation / possible workup/monitoring at [facility] emergency Department.     Patient will be transported to the ED by private vehicle    Triage RN called and report was given    Advised patient to remain NPO until further instructed

## 2019-09-12 NOTE — ED Notes (Signed)
09/12/19 2014   Expected Call-In Information   ED Service [facility] Adult Call-in   PCP/Service Referral [facility]   Call received from [facility] Transfer Center? No   Pt Info note/Reason for sending PT GOT FISH HOOK STUCK IN RIGHT THUMB OF DOMINANT HAND.  AFTER MAKING SMALL INCISION WHILE TRYING TO REMOVE IT PT WAS HAVING SHARP PAIN UP HIS ARM. NOT ABLE TO GET HOOK OUT   Pt Coming from Urgent Care   Temp 36.3 C (97.3 F)   Pulse 81   Resp 18   BP 145/87   SP02 98   Relevant Medications TDAP   Requested Evaluation By ED   Call reported to CALL IN NOTE

## 2019-09-13 ENCOUNTER — Encounter: Payer: Self-pay | Admitting: Student in an Organized Health Care Education/Training Program

## 2019-09-13 MED ORDER — CEPHALEXIN 500 MG PO CAPS *I*
500.0000 mg | ORAL_CAPSULE | Freq: Four times a day (QID) | ORAL | 0 refills | Status: AC
Start: 2019-09-13 — End: 2019-09-18

## 2019-09-13 NOTE — UC Provider Note (Signed)
History     Chief Complaint   Patient presents with    Finger Injury     Stephen Graham is a right-handed 29 y.o. male who presents to urgent care with fishhook in his right thumb.  Patient states that approximately 1900 he was fishing when a right fishhook became lodged into the side of his thumb.  Patient states he was trying to unfold a chair when he tugged on it and it lodged into his soft tissue.  Patient has attempted to remove the hook himself, he has not been successful secondary to pain.  Patient is unable to recall last tetanus booster.  Patient has not taken any medication for symptoms.  Denies fever, chills, erythema, streaking, numbness, weakness, tingling, loss of range of motion.      History provided by:  Patient  Language interpreter used: No        Medical/Surgical/Family History     Past Medical History:   Diagnosis Date    Asthma         There is no problem list on file for this patient.           History reviewed. No pertinent surgical history.  History reviewed. No pertinent family history.       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   Respiratory: Negative for cough and shortness of breath.    Cardiovascular: Negative for chest pain and palpitations.   Gastrointestinal: Negative for nausea and vomiting.   Musculoskeletal: Negative for arthralgias and joint swelling.   Skin: Positive for wound. Negative for color change.        Foreign body in right thumb   Neurological: Negative for dizziness, weakness, numbness and headaches.       Physical Exam   Triage  Vitals  Triage Start: Start, (09/12/19 1929)   First Recorded BP: 145/87, Resp: 18, Temp: 36.3 C (97.3 F), Temp src: TEMPORAL Oxygen Therapy SpO2: 98 %, Oximetry Source: Lt Hand, O2 Device: None (Room air), Heart Rate: 81, (09/12/19 1933)  .  First Pain Reported  0-10 Scale: 5, Pain Location/Orientation: Finger (Comment which one) (thumb right), (09/12/19 1933)       Physical Exam  Vitals and nursing note reviewed.   Constitutional:       Appearance: Normal appearance.   HENT:      Head: Normocephalic.      Right Ear: Hearing normal.      Left Ear: Hearing normal.      Nose: Nose normal.   Eyes:      Extraocular Movements: Extraocular movements intact.      Conjunctiva/sclera: Conjunctivae normal.   Cardiovascular:      Rate and Rhythm: Normal rate and regular rhythm.      Heart sounds: Normal heart sounds.   Pulmonary:  Effort: Pulmonary effort is normal.      Breath sounds: Normal breath sounds.   Abdominal:      General: Bowel sounds are normal.      Palpations: Abdomen is soft.   Musculoskeletal:         General: Normal range of motion.      Right hand: Tenderness present. No swelling, deformity or bony tenderness. Normal range of motion.      Cervical back: Normal range of motion.      Comments: Foreign body in medial aspect of right thumb.  Patient is able to flex at DIP and PIP joints.  No bony tenderness or overt deformity.   Skin:     General: Skin is warm and dry.      Comments: Fishhook lodged into medial aspect of right thumb.  Slight bleeding surrounding injection site, no erythema, warmth, fluctuation or purulent drainage.   Neurological:      Mental Status: He is alert and oriented to person, place, and time.      Comments: CMS and sensation intact.   Psychiatric:         Mood and Affect: Mood normal.         Behavior: Behavior is cooperative.         Thought Content: Thought content normal.          Medical Decision Making      Amount and/or Complexity of Data Reviewed  Review and summarize  past medical records: yes        Initial Evaluation:  ED First Provider Contact     Date/Time Event User Comments    09/12/19 1930 ED First Provider Contact Caprice Kluver Initial Face to Face Provider Contact          Patient was seen on: 09/12/2019    Assessment:  28 y.o.male comes to the Urgent Care Center with fishhook in right thumb.  Patient is alert, afebrile vital signs are stable.  Foreign body lodged into medial aspect of right thumb.  Sensation and CMS intact.  Patient has full range of motion again strength without difficulty.  Unable to recall last Tdap booster    Differential Diagnosis includes:  Foreign body  Puncture wound  Laceration  Infection    Plan: Writer attempted to remove fishhook in urgent care without success, see procedure note.  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  Tdap updated prior to discharge.  Patient will transfer to [facility] at this time.        Orders Placed This Encounter    Foreign body removal    Tdap (BOOSTRIX): tetanus, diphtheria, and accellar pertussis injection 0.5 mL     No results found for this or any previous visit (from the past 24 hour(s)).    Final Diagnosis    ICD-10-CM ICD-9-CM   1. Foreign body of right thumb, initial encounter  S60.351A 915.6       Encourage fluids, encourage rest, good hand hygiene.    Use over the counter medications as discussed.    Please start the new medications as below:    Current Discharge Medication List          Please follow up with your physician as below:    Follow-up Information     Lorine Bears, MD.    Specialty: Pediatrics  Contact information:  [address]  [place] [address]  [phone]             [facility] [place]    Specialty: Emergency Medicine  Why: As needed, If symptoms worsen  Contact  information:  [address] [place] [address]  [place] [address]  [phone]                 Discharge Instructions       Due to high concern for fishhook in right thumb with unsuccessful attempt of removal in urgent care, advised that patient seeks further evaluation / possible workup/monitoring at [facility] emergency Department.     Patient will be transported to the ED by private vehicle    Triage RN called and report was given    Advised patient to remain NPO until further instructed          Dragon voice recognition software was used for part/all of this encounter. Errors in grammar were changed and fixed to the best of my ability.      Final Diagnosis  Final diagnoses:   [R91.638G] Foreign body of right thumb, initial encounter (Primary)         Devin Going, NP              Devin Going, NP  09/13/19 [phone]

## 2019-09-13 NOTE — Discharge Instructions (Signed)
You had a fishhook removed from your thumb.  Your tetanus was updated and you were put on a course of antibiotics.  If you have any worsening pain, new numbness or unresolving numbness, difficulty moving your thumb please return to the ED.  If you develop a fever or any drainage from the wound site please return to the ED as well.

## 2019-09-13 NOTE — ED Notes (Signed)
Patient seen and discharged by provider.

## 2019-09-13 NOTE — ED Procedure Documentation (Addendum)
Procedures   Nerve block  Performed by: Marvell Fuller, DO  Authorized by: Noah Delaine, MD     Consent:     Consent obtained:  Verbal    Consent given by:  Patient    Risks discussed:  Pain, nerve damage, swelling and unsuccessful block  Universal protocol:     Patient identity confirmed:  Verbally with patient  Indications:     Indications:  Procedural anesthesia  Location:     Body area:  Upper extremity    Upper extremity nerve:  Metacarpal    Laterality:  Right  Pre-procedure details:     Skin preparation:  Alcohol  Skin anesthesia (see MAR for exact dosages):     Skin anesthesia method:  Local infiltration    Local anesthetic:  Lidocaine 1% w/o epi    Anesthetic total (ml):  4  Procedure details (see MAR for exact dosages):     Block needle gauge:  27 G    Guidance:  Anatomical landmarks    Injection procedure:  Anatomic landmarks identified, introduced needle, negative aspiration for blood and anatomic landmarks palpated  Post-procedure details:     Dressing:  None    Patient tolerance of procedure:  Tolerated well, no immediate complications        Marvell Fuller, DO     Marvell Fuller, DO  Resident  09/13/19 [phone]      I was present and participated during the critical and key portions and was immediately available during the remainder of the procedure.    Noah Delaine, MD       Noah Delaine, MD  09/13/19 [phone]
